# Patient Record
Sex: Female | Born: 1950 | Race: White | Hispanic: No | Smoking: Never smoker
Health system: Southern US, Community
[De-identification: ages and names within clinical notes are randomized; demographics above are authoritative.]

## PROBLEM LIST (undated history)

## (undated) DIAGNOSIS — E079 Disorder of thyroid, unspecified: Secondary | ICD-10-CM

---

## 1990-02-16 HISTORY — PX: BREAST BIOPSY: SHX20

## 2007-09-29 ENCOUNTER — Ambulatory Visit: Payer: Self-pay | Admitting: Internal Medicine

## 2010-03-26 ENCOUNTER — Ambulatory Visit: Payer: Self-pay | Admitting: Unknown Physician Specialty

## 2011-05-05 ENCOUNTER — Ambulatory Visit: Payer: Self-pay | Admitting: Internal Medicine

## 2011-05-28 ENCOUNTER — Ambulatory Visit: Payer: Self-pay | Admitting: Family Medicine

## 2012-06-23 ENCOUNTER — Ambulatory Visit: Payer: Self-pay | Admitting: Family Medicine

## 2014-06-01 ENCOUNTER — Other Ambulatory Visit: Payer: Self-pay | Admitting: Family Medicine

## 2014-06-01 DIAGNOSIS — Z1231 Encounter for screening mammogram for malignant neoplasm of breast: Secondary | ICD-10-CM

## 2014-07-18 ENCOUNTER — Other Ambulatory Visit: Payer: Self-pay | Admitting: Obstetrics and Gynecology

## 2014-07-18 ENCOUNTER — Ambulatory Visit
Admission: RE | Admit: 2014-07-18 | Discharge: 2014-07-18 | Disposition: A | Discharge status: Home / Self Care | Payer: BC Managed Care – PPO | Source: Ambulatory Visit | Attending: Family Medicine | Admitting: Family Medicine

## 2014-07-18 DIAGNOSIS — R922 Inconclusive mammogram: Secondary | ICD-10-CM | POA: Insufficient documentation

## 2014-07-18 DIAGNOSIS — Z1231 Encounter for screening mammogram for malignant neoplasm of breast: Secondary | ICD-10-CM

## 2015-07-02 ENCOUNTER — Other Ambulatory Visit: Payer: Self-pay | Admitting: Family Medicine

## 2015-07-02 DIAGNOSIS — Z1231 Encounter for screening mammogram for malignant neoplasm of breast: Secondary | ICD-10-CM

## 2015-07-22 ENCOUNTER — Ambulatory Visit
Admission: RE | Admit: 2015-07-22 | Discharge: 2015-07-22 | Disposition: A | Discharge status: Home / Self Care | Payer: Medicare Other | Source: Ambulatory Visit | Attending: Family Medicine | Admitting: Family Medicine

## 2015-07-22 DIAGNOSIS — Z1231 Encounter for screening mammogram for malignant neoplasm of breast: Secondary | ICD-10-CM | POA: Diagnosis present

## 2016-06-30 ENCOUNTER — Other Ambulatory Visit: Payer: Self-pay | Admitting: Family Medicine

## 2016-06-30 DIAGNOSIS — Z1231 Encounter for screening mammogram for malignant neoplasm of breast: Secondary | ICD-10-CM

## 2016-07-27 ENCOUNTER — Ambulatory Visit
Admission: RE | Admit: 2016-07-27 | Discharge: 2016-07-27 | Disposition: A | Discharge status: Home / Self Care | Payer: Medicare Other | Source: Ambulatory Visit | Attending: Family Medicine | Admitting: Family Medicine

## 2016-07-27 DIAGNOSIS — Z1231 Encounter for screening mammogram for malignant neoplasm of breast: Secondary | ICD-10-CM | POA: Insufficient documentation

## 2017-07-16 ENCOUNTER — Other Ambulatory Visit: Payer: Self-pay | Admitting: Family Medicine

## 2017-07-16 DIAGNOSIS — Z1231 Encounter for screening mammogram for malignant neoplasm of breast: Secondary | ICD-10-CM

## 2017-07-28 ENCOUNTER — Encounter (INDEPENDENT_AMBULATORY_CARE_PROVIDER_SITE_OTHER): Payer: Self-pay

## 2017-07-28 ENCOUNTER — Ambulatory Visit
Admission: RE | Admit: 2017-07-28 | Discharge: 2017-07-28 | Disposition: A | Discharge status: Home / Self Care | Payer: Medicare Other | Source: Ambulatory Visit | Attending: Family Medicine | Admitting: Family Medicine

## 2017-07-28 DIAGNOSIS — Z1231 Encounter for screening mammogram for malignant neoplasm of breast: Secondary | ICD-10-CM | POA: Diagnosis present

## 2018-11-23 ENCOUNTER — Other Ambulatory Visit: Payer: Self-pay | Admitting: Family Medicine

## 2018-11-23 DIAGNOSIS — Z1231 Encounter for screening mammogram for malignant neoplasm of breast: Secondary | ICD-10-CM

## 2018-12-20 ENCOUNTER — Encounter (INDEPENDENT_AMBULATORY_CARE_PROVIDER_SITE_OTHER): Payer: Self-pay

## 2018-12-20 ENCOUNTER — Other Ambulatory Visit: Payer: Self-pay

## 2018-12-20 ENCOUNTER — Ambulatory Visit
Admission: RE | Admit: 2018-12-20 | Discharge: 2018-12-20 | Disposition: A | Discharge status: Home / Self Care | Payer: Medicare Other | Source: Ambulatory Visit | Attending: Family Medicine | Admitting: Family Medicine

## 2018-12-20 DIAGNOSIS — Z1231 Encounter for screening mammogram for malignant neoplasm of breast: Secondary | ICD-10-CM | POA: Diagnosis present

## 2019-11-27 ENCOUNTER — Other Ambulatory Visit: Payer: Self-pay | Admitting: Family Medicine

## 2019-11-27 DIAGNOSIS — Z1231 Encounter for screening mammogram for malignant neoplasm of breast: Secondary | ICD-10-CM

## 2019-12-27 ENCOUNTER — Ambulatory Visit
Admission: RE | Admit: 2019-12-27 | Discharge: 2019-12-27 | Disposition: A | Discharge status: Home / Self Care | Payer: Medicare PPO | Source: Ambulatory Visit | Attending: Family Medicine | Admitting: Family Medicine

## 2019-12-27 ENCOUNTER — Other Ambulatory Visit: Payer: Self-pay

## 2019-12-27 DIAGNOSIS — Z1231 Encounter for screening mammogram for malignant neoplasm of breast: Secondary | ICD-10-CM

## 2020-05-27 ENCOUNTER — Emergency Department: Payer: Medicare PPO

## 2020-05-27 ENCOUNTER — Emergency Department
Admission: EM | Admit: 2020-05-27 | Discharge: 2020-05-27 | Disposition: A | Discharge status: Home / Self Care | Payer: Medicare PPO | Attending: Emergency Medicine | Admitting: Emergency Medicine

## 2020-05-27 ENCOUNTER — Other Ambulatory Visit: Payer: Self-pay

## 2020-05-27 DIAGNOSIS — S52501A Unspecified fracture of the lower end of right radius, initial encounter for closed fracture: Secondary | ICD-10-CM | POA: Insufficient documentation

## 2020-05-27 DIAGNOSIS — S42352A Displaced comminuted fracture of shaft of humerus, left arm, initial encounter for closed fracture: Secondary | ICD-10-CM | POA: Diagnosis not present

## 2020-05-27 DIAGNOSIS — Y92009 Unspecified place in unspecified non-institutional (private) residence as the place of occurrence of the external cause: Secondary | ICD-10-CM | POA: Insufficient documentation

## 2020-05-27 DIAGNOSIS — S0031XA Abrasion of nose, initial encounter: Secondary | ICD-10-CM | POA: Diagnosis not present

## 2020-05-27 DIAGNOSIS — S4992XA Unspecified injury of left shoulder and upper arm, initial encounter: Secondary | ICD-10-CM | POA: Diagnosis present

## 2020-05-27 DIAGNOSIS — W010XXA Fall on same level from slipping, tripping and stumbling without subsequent striking against object, initial encounter: Secondary | ICD-10-CM | POA: Insufficient documentation

## 2020-05-27 DIAGNOSIS — S62102A Fracture of unspecified carpal bone, left wrist, initial encounter for closed fracture: Secondary | ICD-10-CM

## 2020-05-27 DIAGNOSIS — W19XXXA Unspecified fall, initial encounter: Secondary | ICD-10-CM

## 2020-05-27 HISTORY — DX: Disorder of thyroid, unspecified: E07.9

## 2020-05-27 MED ORDER — ONDANSETRON 8 MG PO TBDP: 8.0000 mg | ORAL_TABLET | Freq: Three times a day (TID) | ORAL | 0 refills | Status: DC | PRN

## 2020-05-27 MED ORDER — ONDANSETRON HCL 4 MG/2ML IJ SOLN
4.0000 mg | Freq: Once | INTRAMUSCULAR | Status: AC
  Administered 2020-05-27: 4 mg via INTRAVENOUS
  Filled 2020-05-27: qty 2

## 2020-05-27 MED ORDER — HYDROMORPHONE HCL 1 MG/ML IJ SOLN
0.5000 mg | Freq: Once | INTRAMUSCULAR | Status: AC
  Administered 2020-05-27: 0.5 mg via INTRAVENOUS
  Filled 2020-05-27: qty 1

## 2020-05-27 MED ORDER — IBUPROFEN 600 MG PO TABS: 600.0000 mg | ORAL_TABLET | Freq: Three times a day (TID) | ORAL | 0 refills | Status: DC | PRN

## 2020-05-27 MED ORDER — OXYCODONE-ACETAMINOPHEN 5-325 MG PO TABS
1.0000 | ORAL_TABLET | Freq: Once | ORAL | Status: AC
  Administered 2020-05-27: 1 via ORAL
  Filled 2020-05-27: qty 1

## 2020-05-27 MED ORDER — OXYCODONE-ACETAMINOPHEN 5-325 MG PO TABS: 1.0000 | ORAL_TABLET | ORAL | 0 refills | Status: DC | PRN

## 2020-05-27 MED ORDER — FAMOTIDINE IN NACL 20-0.9 MG/50ML-% IV SOLN
20.0000 mg | Freq: Once | INTRAVENOUS | Status: AC
  Administered 2020-05-27: 20 mg via INTRAVENOUS
  Filled 2020-05-27: qty 50

## 2020-05-27 NOTE — ED Notes (Signed)
XR at bedside

## 2020-05-27 NOTE — Discharge Instructions (Addendum)
1.  You may take pain medicines as needed (Motrin/Percocet). 2.  Wear sling at all times. 3.  Keep splint clean and dry. 4.  You may apply ice to the area of swelling several times daily to reduce swelling. 5.  Return to the ER for worsening symptoms, persistent vomiting, difficulty breathing or other concerns.

## 2020-05-27 NOTE — ED Notes (Signed)
SW at bedside at this time

## 2020-05-27 NOTE — ED Provider Notes (Signed)
-----------------------------------------   11:01 AM on 05/27/2020 -----------------------------------------  The patient has been evaluated by social work and arrangements have been made for home health.  She is stable for discharge at this time.  Prescriptions have been sent to her requested pharmacy.  Return precautions provided.   Dionne Bucy, MD 05/27/20 1101

## 2020-05-27 NOTE — ED Notes (Signed)
Pt given breakfast tray at this time. 

## 2020-05-27 NOTE — ED Notes (Addendum)
Chief Complaint  Patient presents with  . Fall    Pt tripped and fell at her home. Endorses L shoulder and arm pain. States she thinks she hit her head. -LOC -blood thinners   Pt presenting via EMS from home with the above complaint. Denies pain other than in L arm. Pt guarding L arm upon arrival. Pt noted to have L upper arm deformity. Pt's nightgown cut off, pt placed in hospital gown. AAO VSS

## 2020-05-27 NOTE — TOC Transition Note (Signed)
Transition of Care [facility]) - CM/SW Discharge Note   Patient Details  Name: Melody Reed MRN: [id number] Date of Birth: [birth year]  [facility] CM/SW Contact:  Marina Goodell Phone Number: [phone] 05/27/2020, 11:46 AM   Clinical Narrative:    Patient will d/c home with home health PT, OT and [facility] A[facility], with [facility].  Patient's will pick her up.     Barriers to Discharge: No Barriers Identified   Patient Goals and CMS Choice Patient states their goals for this hospitalization and ongoing recovery are:: Get better      Discharge Placement                       Discharge Plan and Services                                     Social Determinants of Health (SDOH) Interventions     Readmission Risk Interventions No flowsheet data found.

## 2020-05-27 NOTE — ED Notes (Signed)
Pt requesting to speak with EDP. Pt stated she think something is "disconnected" in her arm. Pt stated she is thinking about moving her arm up and down but is it not moving. This RN tried to educate pt that her arm is fractured and is splinted in a sling and that it may be hard to move, which is the reasoning for the sling. Pt insisted that she make the EDP aware. Dr. Dolores Frame notified at this time.

## 2020-05-27 NOTE — ED Provider Notes (Signed)
[facility] Emergency Department Provider Note   ____________________________________________   Event Date/Time   First MD Initiated Contact with Patient 05/27/20 0205     (approximate)  I have reviewed the triage vital signs and the nursing notes.   HISTORY  Chief Complaint Fall (Pt tripped and fell at her home. Endorses L shoulder and arm pain. States she thinks she hit her head. -LOC -blood thinners)    HPI ELISABETTA MISHRA is a 70 y.o. female brought to the ED via EMS from home status post mechanical fall.  Patient was watching TV, got up, tripped and fell onto her left, nondominant shoulder and upper arm.  Thinks her glasses cut the bridge of her nose.  Denies LOC.  Denies vision changes, headache, neck pain, chest pain, shortness of breath, abdominal pain, nausea, vomiting or dizziness.  Patient denies anticoagulant use.     Past medical history Depression Heart murmur Graves' disease Hyperlipidemia Osteoarthritis Osteoporosis Thyroid disease There are no problems to display for this patient.   Past Surgical History:  Procedure Laterality Date  . BREAST BIOPSY Left 1992   calcifications - benign    Prior to Admission medications   Medication Sig Start Date End Date Taking? Authorizing Provider  ibuprofen (ADVIL) 600 MG tablet Take 1 tablet (600 mg total) by mouth every 8 (eight) hours as needed. 05/27/20  Yes Irean Hong, MD  oxyCODONE-acetaminophen (PERCOCET/ROXICET) 5-325 MG tablet Take 1 tablet by mouth every 4 (four) hours as needed for severe pain. 05/27/20  Yes Irean Hong, MD    Allergies Patient has no known allergies.  Family History  Problem Relation Age of Onset  . Breast cancer Mother 25    Social History Social History   Tobacco Use  . Smoking status: Never Smoker  . Smokeless tobacco: Never Used  Vaping Use  . Vaping Use: Never used  Substance Use Topics  . Alcohol use: Not Currently  . Drug use: Never     Review of Systems  Constitutional: No fever/chills Eyes: No visual changes. ENT: No sore throat. Cardiovascular: Denies chest pain. Respiratory: Denies shortness of breath. Gastrointestinal: No abdominal pain.  No nausea, no vomiting.  No diarrhea.  No constipation. Genitourinary: Negative for dysuria. Musculoskeletal: Positive for LUE pain.  Negative for back pain. Skin: Negative for rash. Neurological: Negative for headaches, focal weakness or numbness.   ____________________________________________   PHYSICAL EXAM:  VITAL SIGNS: ED Triage Vitals  Enc Vitals Group     BP      Pulse      Resp      Temp      Temp src      SpO2      Weight      Height      Head Circumference      Peak Flow      Pain Score      Pain Loc      Pain Edu?      Excl. in GC?     Constitutional: Alert and oriented. Well appearing and in moderate acute distress. Eyes: Conjunctivae are normal. PERRL. EOMI. Head: Atraumatic. Nose: Small abrasion to right nasal bridge without active bleeding. Mouth/Throat: Mucous membranes are moist.  No dental malocclusion. Neck: No stridor.  No cervical spine tenderness to palpation. Cardiovascular: Normal rate, regular rhythm. Grossly normal heart sounds.  Good peripheral circulation. Respiratory: Normal respiratory effort.  No retractions. Lungs CTAB. Gastrointestinal: Soft and nontender to D palpation. No distention.  No abdominal bruits. No CVA tenderness. Musculoskeletal:  LUE: Deformity of upper humerus.  Arm abducted and internally rotated.  Limited range of motion secondary to pain.  2+ radial pulses.  Brisk, less than 5-second capillary refill. No lower extremity tenderness nor edema.  No joint effusions. Neurologic:  Normal speech and language. No gross focal neurologic deficits are appreciated. No gait instability. Skin:  Skin is warm, dry and intact. No rash noted. Psychiatric: Mood and affect are normal. Speech and behavior are  normal.  ____________________________________________   LABS (all labs ordered are listed, but only abnormal results are displayed)  Labs Reviewed - No data to display ____________________________________________  EKG  None ____________________________________________  RADIOLOGY I, Lorrin Bodner J, personally viewed and evaluated these images (plain radiographs) as part of my medical decision making, as well as reviewing the written report by the radiologist.  ED MD interpretation: Comminuted proximal shaft left humerus fracture, acute mildly impacted distal radius fracture; right hand without acute traumatic injury  Official radiology report(s): DG Wrist Complete Left  Result Date: 05/27/2020 CLINICAL DATA:  Fall with wrist pain EXAM: LEFT WRIST - COMPLETE 3+ VIEW COMPARISON:  None. FINDINGS: Acute mildly impacted distal radius fracture. No subluxation. Degenerative changes at the first [facility] joint and STT interval. IMPRESSION: Acute mildly impacted distal radius fracture. Electronically Signed   By: Jasmine Pang M.D.   On: 05/27/2020 03:19   DG Shoulder Left  Result Date: 05/27/2020 CLINICAL DATA:  Fall with arm pain EXAM: LEFT SHOULDER - 2+ VIEW COMPARISON:  None. FINDINGS: Comminuted fracture of the proximal shaft of the left humerus. No glenohumeral dislocation. IMPRESSION: Comminuted fracture of the proximal shaft of the left humerus. Electronically Signed   By: Deatra Robinson M.D.   On: 05/27/2020 02:28   DG Humerus Left  Result Date: 05/27/2020 CLINICAL DATA:  Fall EXAM: LEFT HUMERUS - 2+ VIEW COMPARISON:  None. FINDINGS: Comminuted fracture of the proximal left humeral shaft. Mild posterior angulation. IMPRESSION: Comminuted fracture of the proximal left humeral shaft. Electronically Signed   By: Deatra Robinson M.D.   On: 05/27/2020 02:32   DG Hand Complete Right  Result Date: 05/27/2020 CLINICAL DATA:  Fall, right hand bruising EXAM: RIGHT HAND - COMPLETE 3+ VIEW COMPARISON:   None. FINDINGS: Three view radiograph right hand demonstrates severe degenerative arthritis of the DIP and PIP joints of the digits with mild lateral subluxation of the second, third, and fourth PIP joints. Milder degenerative changes are noted involving the interphalangeal joint of the thumb as well as the first carpometacarpal joint and first MCP joint of the thumb. No acute fracture or dislocation. Soft tissues are unremarkable. 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 No acute fracture or dislocation. Electronically Signed   By: Helyn Numbers MD   On: 05/27/2020 04:44    ____________________________________________   PROCEDURES  Procedure(s) performed (including Critical Care):  .Splint Application  Date/Time: 05/27/2020 4:12 AM Performed by: Irean Hong, MD Authorized by: Irean Hong, MD   Consent:    Consent obtained:  Verbal   Consent given by:  Patient   Risks, benefits, and alternatives were discussed: yes     Risks discussed:  Discoloration, numbness, pain and swelling   Alternatives discussed:  No treatment Pre-procedure details:    Distal neurologic exam:  Normal   Distal perfusion: distal pulses strong and brisk capillary refill   Procedure details:    Location:  Wrist   Wrist location:  L wrist   Splint type:  Thumb spica   Supplies:  Cotton padding, elastic bandage and plaster Post-procedure details:    Distal neurologic exam:  Normal   Distal perfusion: distal pulses strong     Procedure completion:  Tolerated well, no immediate complications     ____________________________________________   INITIAL IMPRESSION / ASSESSMENT AND PLAN / ED COURSE  As part of my medical decision making, I reviewed the following data within the electronic MEDICAL RECORD NUMBER Nursing notes reviewed and incorporated, Old chart reviewed, Radiograph reviewed, Notes from prior ED visits and [place] Controlled Substance Database     70 year old female presenting  with left arm injury status post mechanical fall.  Differential diagnosis includes but is not limited to shoulder dislocation, humerus fracture, musculoskeletal contusion, etc.  We will obtain plain film x-rays, administer IV Dilaudid for pain and reassess.  Clinical Course as of 05/27/20 0653  Mon May 27, 2020  0236 Discussed with orthopedist on-call Dr. Odis Luster who will look at patient's x-ray and call back with recommendations. [JS]  0303 Dr. Odis Luster recommends sling and follow-up in the office in 2 to 3 days.  Updated patient on plan of care.  She is now complaining of left wrist pain.  Will obtain plain film x-rays.  Pain improved after IV Dilaudid; will administer oral Percocet for additional pain control [JS]  0352 Updated patient on wrist x-ray; will apply thumb spica splint.  Patient concerned she will have no one to help her at home as her son lives in town but works in [place].  Will consult TOC for home health needs. [JS]  [id number] Patient now complains of right finger pain.  Will obtain x-rays. [JS]  0448 Right hand x-rays negative for acute traumatic injury. [JS]  [id number] Splint and sling in place.  Patient resting in no acute distress.  Remains in the ED pending clinical social work assistance for home health. [JS]    Clinical Course User Index [JS] Irean Hong, MD     ____________________________________________   FINAL CLINICAL IMPRESSION(S) / ED DIAGNOSES  Final diagnoses:  Fall, initial encounter  Closed displaced comminuted fracture of shaft of left humerus, initial encounter  Closed fracture of left wrist, initial encounter     ED Discharge Orders         Ordered    ibuprofen (ADVIL) 600 MG tablet  Every 8 hours PRN        05/27/20 0305    oxyCODONE-acetaminophen (PERCOCET/ROXICET) 5-325 MG tablet  Every 4 hours PRN        05/27/20 0305          *Please note:  SHANNELL MIKKELSEN was evaluated in Emergency Department on 05/27/2020 for the symptoms described in the  history of present illness. She was evaluated in the context of the global COVID-19 pandemic, which necessitated consideration that the patient might be at risk for infection with the SARS-CoV-2 virus that causes COVID-19. Institutional protocols and algorithms that pertain to the evaluation of patients at risk for COVID-19 are in a state of rapid change based on information released by regulatory bodies including the CDC and federal and state organizations. These policies and algorithms were followed during the patient's care in the ED.  Some ED evaluations and interventions may be delayed as a result of limited staffing during and the pandemic.*   Note:  This document was prepared using Dragon voice recognition software and may include unintentional dictation errors.   Irean Hong, MD 05/27/20 [phone]

## 2020-11-12 ENCOUNTER — Other Ambulatory Visit: Payer: Self-pay | Admitting: Family Medicine

## 2020-11-12 DIAGNOSIS — Z1231 Encounter for screening mammogram for malignant neoplasm of breast: Secondary | ICD-10-CM

## 2020-12-27 ENCOUNTER — Other Ambulatory Visit: Payer: Self-pay

## 2020-12-27 ENCOUNTER — Ambulatory Visit
Admission: RE | Admit: 2020-12-27 | Discharge: 2020-12-27 | Disposition: A | Discharge status: Home / Self Care | Payer: Medicare PPO | Source: Ambulatory Visit | Attending: Family Medicine | Admitting: Family Medicine

## 2020-12-27 DIAGNOSIS — Z1231 Encounter for screening mammogram for malignant neoplasm of breast: Secondary | ICD-10-CM | POA: Insufficient documentation

## 2022-01-19 ENCOUNTER — Other Ambulatory Visit: Payer: Self-pay | Admitting: Family Medicine

## 2022-01-19 ENCOUNTER — Ambulatory Visit: Payer: Medicare PPO | Admitting: Dermatology

## 2022-01-19 DIAGNOSIS — L988 Other specified disorders of the skin and subcutaneous tissue: Secondary | ICD-10-CM

## 2022-01-19 DIAGNOSIS — L820 Inflamed seborrheic keratosis: Secondary | ICD-10-CM | POA: Diagnosis not present

## 2022-01-19 DIAGNOSIS — L821 Other seborrheic keratosis: Secondary | ICD-10-CM

## 2022-01-19 DIAGNOSIS — Z1231 Encounter for screening mammogram for malignant neoplasm of breast: Secondary | ICD-10-CM

## 2022-01-19 DIAGNOSIS — Z808 Family history of malignant neoplasm of other organs or systems: Secondary | ICD-10-CM

## 2022-01-19 DIAGNOSIS — L578 Other skin changes due to chronic exposure to nonionizing radiation: Secondary | ICD-10-CM | POA: Diagnosis not present

## 2022-01-19 NOTE — Progress Notes (Signed)
   New Patient Visit  Subjective  Melody Reed is a 71 y.o. female who presents for the following: New Patient (Initial Visit) (Reports dad sister had melanoma stage 4./Has history of precancers. /Spot at left neck area /). The patient has spots, moles and lesions to be evaluated, some may be new or changing and the patient has concerns that these could be cancer.  The following portions of the chart were reviewed this encounter and updated as appropriate:   Tobacco  Allergies  Meds  Problems  Med Hx  Surg Hx  Fam Hx     Review of Systems:  No other skin or systemic complaints except as noted in HPI or Assessment and Plan.  Objective  Well appearing patient in no apparent distress; mood and affect are within normal limits.  A focused examination was performed including left neck, and face. Relevant physical exam findings are noted in the Assessment and Plan.  face Rhytides and volume loss.   left neck x 1 Erythematous stuck-on, waxy papule or plaque   Assessment & Plan  Elastosis of skin face Concerned with lines at forehead Frown complex 27 1/2 5 across forehead 32.5 units total  422.50  Discussed botox at corners of mouth 4 units each side Sagging skin and lines around  mid face Discussed fillers with hydrologic acid  650 per syringe   Inflamed seborrheic keratosis left neck x 1 Symptomatic, irritating, patient would like treated.  Destruction of lesion - left neck x 1 Complexity: simple   Destruction method: cryotherapy   Informed consent: discussed and consent obtained   Timeout:  patient name, date of birth, surgical site, and procedure verified Lesion destroyed using liquid nitrogen: Yes   Region frozen until ice ball extended beyond lesion: Yes   Outcome: patient tolerated procedure well with no complications   Post-procedure details: wound care instructions given   Additional details:  Prior to procedure, discussed risks of blister formation,  small wound, skin dyspigmentation, or rare scar following cryotherapy. Recommend Vaseline ointment to treated areas while healing.  Actinic Damage - chronic, secondary to cumulative UV radiation exposure/sun exposure over time - diffuse scaly erythematous macules with underlying dyspigmentation - Recommend daily broad spectrum sunscreen SPF 30+ to sun-exposed areas, reapply every 2 hours as needed.  - Recommend staying in the shade or wearing long sleeves, sun glasses (UVA+UVB protection) and wide brim hats (4-inch brim around the entire circumference of the hat). - Call for new or changing lesions.  Seborrheic Keratoses - Stuck-on, waxy, tan-brown papules and/or plaques  - Benign-appearing - Discussed benign etiology and prognosis. - Observe - Call for any changes  Return for scheduled for botox tomorrow at 1:30 pm with Dr. Verdell Face, Asher Muir, CMA, am acting as scribe for Armida Sans, MD. Documentation: I have reviewed the above documentation for accuracy and completeness, and I agree with the above.  Armida Sans, MD

## 2022-01-19 NOTE — Patient Instructions (Addendum)
Seborrheic Keratosis  What causes seborrheic keratoses? Seborrheic keratoses are harmless, common skin growths that first appear during adult life.  As time goes by, more growths appear.  Some people may develop a large number of them.  Seborrheic keratoses appear on both covered and uncovered body parts.  They are not caused by sunlight.  The tendency to develop seborrheic keratoses can be inherited.  They vary in color from skin-colored to gray, brown, or even black.  They can be either smooth or have a rough, warty surface.   Seborrheic keratoses are superficial and look as if they were stuck on the skin.  Under the microscope this type of keratosis looks like layers upon layers of skin.  That is why at times the top layer may seem to fall off, but the rest of the growth remains and re-grows.    Treatment Seborrheic keratoses do not need to be treated, but can easily be removed in the office.  Seborrheic keratoses often cause symptoms when they rub on clothing or jewelry.  Lesions can be in the way of shaving.  If they become inflamed, they can cause itching, soreness, or burning.  Removal of a seborrheic keratosis can be accomplished by freezing, burning, or surgery. If any spot bleeds, scabs, or grows rapidly, please return to have it checked, as these can be an indication of a skin cancer.  Cryotherapy Aftercare  Wash gently with soap and water everyday.   Apply Vaseline and Band-Aid daily until healed.    Due to recent changes in healthcare laws, you may see results of your pathology and/or laboratory studies on MyChart before the doctors have had a chance to review them. We understand that in some cases there may be results that are confusing or concerning to you. Please understand that not all results are received at the same time and often the doctors may need to interpret multiple results in order to provide you with the best plan of care or course of treatment. Therefore, we ask that you  please give us 2 business days to thoroughly review all your results before contacting the office for clarification. Should we see a critical lab result, you will be contacted sooner.   If You Need Anything After Your Visit  If you have any questions or concerns for your doctor, please call our main line at [phone] and press option 4 to reach your doctor's medical assistant. If no one answers, please leave a voicemail as directed and we will return your call as soon as possible. Messages left after 4 pm will be answered the following business day.   You may also send us a message via MyChart. We typically respond to MyChart messages within 1-2 business days.  For prescription refills, please ask your pharmacy to contact our office. Our fax number is [phone].  If you have an urgent issue when the clinic is closed that cannot wait until the next business day, you can page your doctor at the number below.    Please note that while we do our best to be available for urgent issues outside of office hours, we are not available 24/7.   If you have an urgent issue and are unable to reach us, you may choose to seek medical care at your doctor's office, retail clinic, urgent care center, or emergency room.  If you have a medical emergency, please immediately call 911 or go to the emergency department.  Pager Numbers  - Dr. Kowalski: [phone]  -   Dr. Moye: [phone]  - Dr. Stewart: [phone]  In the event of inclement weather, please call our main line at [phone] for an update on the status of any delays or closures.  Dermatology Medication Tips: Please keep the boxes that topical medications come in in order to help keep track of the instructions about where and how to use these. Pharmacies typically print the medication instructions only on the boxes and not directly on the medication tubes.   If your medication is too expensive, please contact our office at  [phone] option 4 or send us a message through MyChart.   We are unable to tell what your co-pay for medications will be in advance as this is different depending on your insurance coverage. However, we may be able to find a substitute medication at lower cost or fill out paperwork to get insurance to cover a needed medication.   If a prior authorization is required to get your medication covered by your insurance company, please allow us 1-2 business days to complete this process.  Drug prices often vary depending on where the prescription is filled and some pharmacies may offer cheaper prices.  The website www.goodrx.com contains coupons for medications through different pharmacies. The prices here do not account for what the cost may be with help from insurance (it may be cheaper with your insurance), but the website can give you the price if you did not use any insurance.  - You can print the associated coupon and take it with your prescription to the pharmacy.  - You may also stop by our office during regular business hours and pick up a GoodRx coupon card.  - If you need your prescription sent electronically to a different pharmacy, notify our office through [facility] MyChart or by phone at [phone] option 4.     Si Usted Necesita Algo Despus de Su Visita  Tambin puede enviarnos un mensaje a travs de MyChart. Por lo general respondemos a los mensajes de MyChart en el transcurso de 1 a 2 das hbiles.  Para renovar recetas, por favor pida a su farmacia que se ponga en contacto con nuestra oficina. Nuestro nmero de fax es el [phone].  Si tiene un asunto urgente cuando la clnica est cerrada y que no puede esperar hasta el siguiente da hbil, puede llamar/localizar a su doctor(a) al nmero que aparece a continuacin.   Por favor, tenga en cuenta que aunque hacemos todo lo posible para estar disponibles para asuntos urgentes fuera del horario de oficina, no estamos  disponibles las 24 horas del da, los 7 das de la semana.   Si tiene un problema urgente y no puede comunicarse con nosotros, puede optar por buscar atencin mdica  en el consultorio de su doctor(a), en una clnica privada, en un centro de atencin urgente o en una sala de emergencias.  Si tiene una emergencia mdica, por favor llame inmediatamente al 911 o vaya a la sala de emergencias.  Nmeros de bper  - Dr. Kowalski: [phone]  - Dra. Moye: [phone]  - Dra. Stewart: [phone]  En caso de inclemencias del tiempo, por favor llame a nuestra lnea principal al [phone] para una actualizacin sobre el estado de cualquier retraso o cierre.  Consejos para la medicacin en dermatologa: Por favor, guarde las cajas en las que vienen los medicamentos de uso tpico para ayudarle a seguir las instrucciones sobre dnde y cmo usarlos. Las farmacias generalmente imprimen las instrucciones del medicamento slo en las cajas y   no directamente en los tubos del medicamento.   Si su medicamento es muy caro, por favor, pngase en contacto con nuestra oficina llamando al [phone] y presione la opcin 4 o envenos un mensaje a travs de MyChart.   No podemos decirle cul ser su copago por los medicamentos por adelantado ya que esto es diferente dependiendo de la cobertura de su seguro. Sin embargo, es posible que podamos encontrar un medicamento sustituto a menor costo o llenar un formulario para que el seguro cubra el medicamento que se considera necesario.   Si se requiere una autorizacin previa para que su compaa de seguros cubra su medicamento, por favor permtanos de 1 a 2 das hbiles para completar este proceso.  Los precios de los medicamentos varan con frecuencia dependiendo del lugar de dnde se surte la receta y alguna farmacias pueden ofrecer precios ms baratos.  El sitio web www.goodrx.com tiene cupones para medicamentos de diferentes farmacias. Los precios aqu no  tienen en cuenta lo que podra costar con la ayuda del seguro (puede ser ms barato con su seguro), pero el sitio web puede darle el precio si no utiliz ningn seguro.  - Puede imprimir el cupn correspondiente y llevarlo con su receta a la farmacia.  - Tambin puede pasar por nuestra oficina durante el horario de atencin regular y recoger una tarjeta de cupones de GoodRx.  - Si necesita que su receta se enve electrnicamente a una farmacia diferente, informe a nuestra oficina a travs de MyChart de [facility] o por telfono llamando al [phone] y presione la opcin 4.  

## 2022-01-20 ENCOUNTER — Ambulatory Visit (INDEPENDENT_AMBULATORY_CARE_PROVIDER_SITE_OTHER): Payer: Self-pay | Admitting: Dermatology

## 2022-01-20 DIAGNOSIS — L988 Other specified disorders of the skin and subcutaneous tissue: Secondary | ICD-10-CM

## 2022-01-20 NOTE — Progress Notes (Signed)
   Follow-Up Visit   Subjective  Melody Reed is a 71 y.o. female who presents for the following: Facial Elastosis (Botox today).  The following portions of the chart were reviewed this encounter and updated as appropriate:   Tobacco  Allergies  Meds  Problems  Med Hx  Surg Hx  Fam Hx     Review of Systems:  No other skin or systemic complaints except as noted in HPI or Assessment and Plan.  Objective  Well appearing patient in no apparent distress; mood and affect are within normal limits.  A focused examination was performed including face. Relevant physical exam findings are noted in the Assessment and Plan.  Face Rhytides and volume loss.             Assessment & Plan  Elastosis of skin Face  Botox today - 32.5 units total - Recheck on follow up  Frown Complex - 27.5 units Forehead - 5 units  Discussed Botox to DAOs and filler to bilateral oral commissure. Patient is scheduled for treatment in March 2024.  Botox Injection - Face Location: See attached image  Informed consent: Discussed risks (infection, pain, bleeding, bruising, swelling, allergic reaction, paralysis of nearby muscles, eyelid droop, double vision, neck weakness, difficulty breathing, headache, undesirable cosmetic result, and need for additional treatment) and benefits of the procedure, as well as the alternatives.  Informed consent was obtained.  Preparation: The area was cleansed with alcohol.  Procedure Details:  Botox was injected into the dermis with a 30-gauge needle. Pressure applied to any bleeding. Ice packs offered for swelling.  Lot Number:  [id number] Expiration:  03/2024  Total Units Injected:  32.5  Plan: Patient was instructed to remain upright for 4 hours. Patient was instructed to avoid massaging the face and avoid vigorous exercise for the rest of the day. Tylenol may be used for headache.  Allow 2 weeks before returning to clinic for additional dosing as needed.  Patient will call for any problems.    Return in about 4 weeks (around 02/17/2022).  I, Joanie Coddington, CMA, am acting as scribe for Armida Sans, MD . Documentation: I have reviewed the above documentation for accuracy and completeness, and I agree with the above.  Armida Sans, MD

## 2022-01-20 NOTE — Patient Instructions (Signed)
Due to recent changes in healthcare laws, you may see results of your pathology and/or laboratory studies on MyChart before the doctors have had a chance to review them. We understand that in some cases there may be results that are confusing or concerning to you. Please understand that not all results are received at the same time and often the doctors may need to interpret multiple results in order to provide you with the best plan of care or course of treatment. Therefore, we ask that you please give us 2 business days to thoroughly review all your results before contacting the office for clarification. Should we see a critical lab result, you will be contacted sooner.   If You Need Anything After Your Visit  If you have any questions or concerns for your doctor, please call our main line at [phone] and press option 4 to reach your doctor's medical assistant. If no one answers, please leave a voicemail as directed and we will return your call as soon as possible. Messages left after 4 pm will be answered the following business day.   You may also send us a message via MyChart. We typically respond to MyChart messages within 1-2 business days.  For prescription refills, please ask your pharmacy to contact our office. Our fax number is [phone].  If you have an urgent issue when the clinic is closed that cannot wait until the next business day, you can page your doctor at the number below.    Please note that while we do our best to be available for urgent issues outside of office hours, we are not available 24/7.   If you have an urgent issue and are unable to reach us, you may choose to seek medical care at your doctor's office, retail clinic, urgent care center, or emergency room.  If you have a medical emergency, please immediately call 911 or go to the emergency department.  Pager Numbers  - Dr. Kowalski: [phone]  - Dr. Moye: [phone]  - Dr. Stewart:  [phone]  In the event of inclement weather, please call our main line at [phone] for an update on the status of any delays or closures.  Dermatology Medication Tips: Please keep the boxes that topical medications come in in order to help keep track of the instructions about where and how to use these. Pharmacies typically print the medication instructions only on the boxes and not directly on the medication tubes.   If your medication is too expensive, please contact our office at [phone] option 4 or send us a message through MyChart.   We are unable to tell what your co-pay for medications will be in advance as this is different depending on your insurance coverage. However, we may be able to find a substitute medication at lower cost or fill out paperwork to get insurance to cover a needed medication.   If a prior authorization is required to get your medication covered by your insurance company, please allow us 1-2 business days to complete this process.  Drug prices often vary depending on where the prescription is filled and some pharmacies may offer cheaper prices.  The website www.goodrx.com contains coupons for medications through different pharmacies. The prices here do not account for what the cost may be with help from insurance (it may be cheaper with your insurance), but the website can give you the price if you did not use any insurance.  - You can print the associated coupon and take it with   your prescription to the pharmacy.  - You may also stop by our office during regular business hours and pick up a GoodRx coupon card.  - If you need your prescription sent electronically to a different pharmacy, notify our office through [facility] MyChart or by phone at [phone] option 4.     Si Usted Necesita Algo Despus de Su Visita  Tambin puede enviarnos un mensaje a travs de MyChart. Por lo general respondemos a los mensajes de MyChart en el transcurso de 1 a 2  das hbiles.  Para renovar recetas, por favor pida a su farmacia que se ponga en contacto con nuestra oficina. Nuestro nmero de fax es el [phone].  Si tiene un asunto urgente cuando la clnica est cerrada y que no puede esperar hasta el siguiente da hbil, puede llamar/localizar a su doctor(a) al nmero que aparece a continuacin.   Por favor, tenga en cuenta que aunque hacemos todo lo posible para estar disponibles para asuntos urgentes fuera del horario de oficina, no estamos disponibles las 24 horas del da, los 7 das de la semana.   Si tiene un problema urgente y no puede comunicarse con nosotros, puede optar por buscar atencin mdica  en el consultorio de su doctor(a), en una clnica privada, en un centro de atencin urgente o en una sala de emergencias.  Si tiene una emergencia mdica, por favor llame inmediatamente al 911 o vaya a la sala de emergencias.  Nmeros de bper  - Dr. Kowalski: [phone]  - Dra. Moye: [phone]  - Dra. Stewart: [phone]  En caso de inclemencias del tiempo, por favor llame a nuestra lnea principal al [phone] para una actualizacin sobre el estado de cualquier retraso o cierre.  Consejos para la medicacin en dermatologa: Por favor, guarde las cajas en las que vienen los medicamentos de uso tpico para ayudarle a seguir las instrucciones sobre dnde y cmo usarlos. Las farmacias generalmente imprimen las instrucciones del medicamento slo en las cajas y no directamente en los tubos del medicamento.   Si su medicamento es muy caro, por favor, pngase en contacto con nuestra oficina llamando al [phone] y presione la opcin 4 o envenos un mensaje a travs de MyChart.   No podemos decirle cul ser su copago por los medicamentos por adelantado ya que esto es diferente dependiendo de la cobertura de su seguro. Sin embargo, es posible que podamos encontrar un medicamento sustituto a menor costo o llenar un formulario para que el  seguro cubra el medicamento que se considera necesario.   Si se requiere una autorizacin previa para que su compaa de seguros cubra su medicamento, por favor permtanos de 1 a 2 das hbiles para completar este proceso.  Los precios de los medicamentos varan con frecuencia dependiendo del lugar de dnde se surte la receta y alguna farmacias pueden ofrecer precios ms baratos.  El sitio web www.goodrx.com tiene cupones para medicamentos de diferentes farmacias. Los precios aqu no tienen en cuenta lo que podra costar con la ayuda del seguro (puede ser ms barato con su seguro), pero el sitio web puede darle el precio si no utiliz ningn seguro.  - Puede imprimir el cupn correspondiente y llevarlo con su receta a la farmacia.  - Tambin puede pasar por nuestra oficina durante el horario de atencin regular y recoger una tarjeta de cupones de GoodRx.  - Si necesita que su receta se enve electrnicamente a una farmacia diferente, informe a nuestra oficina a travs de MyChart de [place]   o por telfono llamando al [phone] y presione la opcin 4.  

## 2022-02-01 ENCOUNTER — Encounter: Payer: Self-pay | Admitting: Dermatology

## 2022-02-24 ENCOUNTER — Ambulatory Visit (INDEPENDENT_AMBULATORY_CARE_PROVIDER_SITE_OTHER): Payer: Medicare PPO | Admitting: Dermatology

## 2022-02-24 ENCOUNTER — Encounter: Payer: Self-pay | Admitting: Dermatology

## 2022-02-24 DIAGNOSIS — L988 Other specified disorders of the skin and subcutaneous tissue: Secondary | ICD-10-CM

## 2022-02-24 DIAGNOSIS — Z79899 Other long term (current) drug therapy: Secondary | ICD-10-CM | POA: Diagnosis not present

## 2022-02-24 DIAGNOSIS — L820 Inflamed seborrheic keratosis: Secondary | ICD-10-CM | POA: Diagnosis not present

## 2022-02-24 DIAGNOSIS — L308 Other specified dermatitis: Secondary | ICD-10-CM

## 2022-02-24 MED ORDER — MOMETASONE FUROATE 0.1 % EX CREA: TOPICAL_CREAM | CUTANEOUS | 1 refills | Status: AC

## 2022-02-24 NOTE — Patient Instructions (Addendum)
Start Mometasone cream twice daily 2 weeks.  Topical steroids (such as triamcinolone, fluocinolone, fluocinonide, mometasone, clobetasol, halobetasol, betamethasone, hydrocortisone) can cause thinning and lightening of the skin if they are used for too long in the same area. Your physician has selected the right strength medicine for your problem and area affected on the body. Please use your medication only as directed by your physician to prevent side effects.    Topical steroids (such as triamcinolone, fluocinolone, fluocinonide, mometasone, clobetasol, halobetasol, betamethasone, hydrocortisone) can cause thinning and lightening of the skin if they are used for too long in the same area. Your physician has selected the right strength medicine for your problem and area affected on the body. Please use your medication only as directed by your physician to prevent side effects.    Cryotherapy Aftercare  Wash gently with soap and water everyday.   Apply Vaseline and Band-Aid daily until healed.    Due to recent changes in healthcare laws, you may see results of your pathology and/or laboratory studies on MyChart before the doctors have had a chance to review them. We understand that in some cases there may be results that are confusing or concerning to you. Please understand that not all results are received at the same time and often the doctors may need to interpret multiple results in order to provide you with the best plan of care or course of treatment. Therefore, we ask that you please give [place] 2 business days to thoroughly review all your results before contacting the office for clarification. Should we see a critical lab result, you will be contacted sooner.   If You Need Anything After Your Visit  If you have any questions or concerns for your doctor, please call our main line at [phone] and press option 4 to reach your doctor's medical assistant. If no one answers, please leave a  voicemail as directed and we will return your call as soon as possible. Messages left after 4 pm will be answered the following business day.   You may also send [place] a message via MyChart. We typically respond to MyChart messages within 1-2 business days.  For prescription refills, please ask your pharmacy to contact our office. Our fax number is [phone].  If you have an urgent issue when the clinic is closed that cannot wait until the next business day, you can page your doctor at the number below.    Please note that while we do our best to be available for urgent issues outside of office hours, we are not available 24/7.   If you have an urgent issue and are unable to reach [place], you may choose to seek medical care at your doctor's office, retail clinic, urgent care center, or emergency room.  If you have a medical emergency, please immediately call 911 or go to the emergency department.  Pager Numbers  - Dr. Gwen Pounds: [phone]  - Dr. Neale Burly: [phone]  - Dr. Roseanne Reno: [phone]  In the event of inclement weather, please call our main line at [phone] for an update on the status of any delays or closures.  Dermatology Medication Tips: Please keep the boxes that topical medications come in in order to help keep track of the instructions about where and how to use these. Pharmacies typically print the medication instructions only on the boxes and not directly on the medication tubes.   If your medication is too expensive, please contact our office at [phone] option 4 or send [place] a  message through [place].   We are unable to tell what your co-pay for medications will be in advance as this is different depending on your insurance coverage. However, we may be able to find a substitute medication at lower cost or fill out paperwork to get insurance to cover a needed medication.   If a prior authorization is required to get your medication covered by your insurance  company, please allow [place] 1-2 business days to complete this process.  Drug prices often vary depending on where the prescription is filled and some pharmacies may offer cheaper prices.  The website www.goodrx.com contains coupons for medications through different pharmacies. The prices here do not account for what the cost may be with help from insurance (it may be cheaper with your insurance), but the website can give you the price if you did not use any insurance.  - You can print the associated coupon and take it with your prescription to the pharmacy.  - You may also stop by our office during regular business hours and pick up a GoodRx coupon card.  - If you need your prescription sent electronically to a different pharmacy, notify our office through [facility] or by phone at [phone] option 4.     Si Usted Necesita Algo Despus de Su Visita  Tambin puede enviarnos un mensaje a travs de Pharmacist, community. Por lo general respondemos a los mensajes de MyChart en el transcurso de 1 a 2 das hbiles.  Para renovar recetas, por favor pida a su farmacia que se ponga en contacto con nuestra oficina. Harland Dingwall de fax es [place] [phone].  Si tiene un asunto urgente cuando la clnica est cerrada y que no puede esperar hasta el siguiente da hbil, puede llamar/localizar a su doctor(a) al nmero que aparece a continuacin.   Por favor, tenga en cuenta que aunque hacemos todo lo posible para estar disponibles para asuntos urgentes fuera del horario de [place], no estamos disponibles las 24 horas del da, los 7 das de la [place].   Si tiene un problema urgente y no puede comunicarse con nosotros, puede optar por buscar atencin mdica  en el consultorio de su doctor(a), en una clnica privada, en un centro de atencin urgente o en una sala de emergencias.  Si tiene Engineering geologist, por favor llame inmediatamente al 911 o vaya a la sala de emergencias.  Nmeros de bper  - Dr.  Nehemiah Massed: [phone]  - Dra. Moye: [phone]  - Dra. Nicole Kindred: [phone]  En caso de inclemencias del [place], por favor llame a Johnsie Kindred principal al [phone] para una actualizacin sobre el [place] de cualquier retraso o cierre.  Consejos para la medicacin en dermatologa: Por favor, guarde las cajas en las que vienen los medicamentos de uso tpico para ayudarle a seguir las instrucciones sobre dnde y cmo usarlos. Las farmacias generalmente imprimen las instrucciones del medicamento slo en las cajas y no directamente en los tubos del [place].   Si su medicamento es muy caro, por favor, pngase en contacto con Zigmund Daniel llamando al [phone] y presione la opcin 4 o envenos un mensaje a travs de Pharmacist, community.   No podemos decirle cul ser su copago por los medicamentos por adelantado ya que esto es diferente dependiendo de la cobertura de su seguro. Sin embargo, es posible que podamos encontrar un medicamento sustituto a Electrical engineer un formulario para que el seguro cubra el medicamento que se considera necesario.   Si se requiere [employer]  autorizacin previa para que su compaa de seguros [place] su medicamento, por favor permtanos de 1 a 2 das hbiles para completar este proceso.  Los precios de los medicamentos varan con frecuencia dependiendo del Environmental consultant de dnde se surte la receta y alguna farmacias pueden ofrecer precios ms baratos.  El sitio web www.goodrx.com tiene cupones para medicamentos de Airline pilot. Los precios aqu no tienen en cuenta lo que podra costar con la ayuda del seguro (puede ser ms barato con su seguro), pero el sitio web puede darle el precio si no utiliz Research scientist (physical sciences).  - Puede imprimir el cupn correspondiente y llevarlo con su receta a la farmacia.  - Tambin puede pasar por nuestra oficina durante el horario de atencin regular y Charity fundraiser una tarjeta de cupones de GoodRx.  - Si necesita que su receta se enve  electrnicamente a una farmacia diferente, informe a nuestra oficina a travs de MyChart de [place] o por telfono llamando al [phone] y presione la opcin 4.

## 2022-02-24 NOTE — Progress Notes (Signed)
   Follow-Up Visit   Subjective  Melody Reed is a 72 y.o. female who presents for the following: Facial Elastosis (4 week Botox recheck) and lesion (Dur: ~1 month. Scaly spot at right upper, posterior thigh. Getting larger. Has itched. Recheck ISK, left neck, Tx with LN2). The patient has spots, moles and lesions to be evaluated, some may be new or changing and the patient has concerns.  The following portions of the chart were reviewed this encounter and updated as appropriate:  Tobacco  Allergies  Meds  Problems  Med Hx  Surg Hx  Fam Hx     Review of Systems: No other skin or systemic complaints except as noted in HPI or Assessment and Plan.  Objective  Well appearing patient in no apparent distress; mood and affect are within normal limits.  A focused examination was performed including face, neck, right thigh. Relevant physical exam findings are noted in the Assessment and Plan.  face Rhytides and volume loss.   Neck - Anterior Erythematous keratotic or waxy stuck-on papule or plaque.  Right Hip (side) - Posterior Pink, scaly, nummular patch   Assessment & Plan  Elastosis of skin face Plan 2 syringes of Voluma at mid face Plan 1 syringe Restylane Refyne or Defyne at oral commissures Excellent results from BOTOX treatment. Discussed Blepharoplasty.  Inflamed seborrheic keratosis Neck - Left lateral Symptomatic, irritating, patient would like treated. Destruction of lesion - Neck - Anterior Complexity: simple   Destruction method: cryotherapy   Informed consent: discussed and consent obtained   Timeout:  patient name, date of birth, surgical site, and procedure verified Lesion destroyed using liquid nitrogen: Yes   Region frozen until ice ball extended beyond lesion: Yes   Outcome: patient tolerated procedure well with no complications   Post-procedure details: wound care instructions given    Other eczema Right Hip (side) - Posterior Chronic and  persistent condition with duration or expected duration over one year. Condition is symptomatic / bothersome to patient. Not to goal. Atopic dermatitis (eczema) is a chronic, relapsing, pruritic condition that can significantly affect quality of life. It is often associated with allergic rhinitis and/or asthma and can require treatment with topical medications, phototherapy, or in severe cases biologic injectable medication (Dupixent; Adbry) or Oral JAK inhibitors.  May want to see Allergist for allergies. Start Mometasone cream twice daily 2 weeks.  Topical steroids (such as triamcinolone, fluocinolone, fluocinonide, mometasone, clobetasol, halobetasol, betamethasone, hydrocortisone) can cause thinning and lightening of the skin if they are used for too long in the same area. Your physician has selected the right strength medicine for your problem and area affected on the body. Please use your medication only as directed by your physician to prevent side effects.   mometasone (ELOCON) 0.1 % cream - Right Hip (side) - Posterior Apply twice daily up to 2 weeks as needed  Return for fillers as scheduled.  I, Emelia Salisbury, CMA, am acting as scribe for Sarina Ser, MD. Documentation: I have reviewed the above documentation for accuracy and completeness, and I agree with the above.  Sarina Ser, MD

## 2022-03-04 ENCOUNTER — Ambulatory Visit
Admission: RE | Admit: 2022-03-04 | Discharge: 2022-03-04 | Disposition: A | Discharge status: Home / Self Care | Payer: Medicare PPO | Source: Ambulatory Visit | Attending: Family Medicine | Admitting: Family Medicine

## 2022-03-04 DIAGNOSIS — Z1231 Encounter for screening mammogram for malignant neoplasm of breast: Secondary | ICD-10-CM | POA: Diagnosis present

## 2022-04-20 ENCOUNTER — Telehealth: Payer: Self-pay

## 2022-04-20 NOTE — Telephone Encounter (Signed)
Pt called triage wanting to know how long she was on Fosamax. Pt states Dr Rayford Halsted was her dr. I advised her this iffice joined [facility] in 2017 and we do not have any records prior to that in this system.

## 2022-05-09 IMAGING — MG MM DIGITAL SCREENING BILAT W/ TOMO AND CAD
6 of 12 series · 6 of 36 positions shown · non-contrast
Comparison: Previous exam(s).

CLINICAL DATA: Screening.

EXAM:
DIGITAL SCREENING BILATERAL MAMMOGRAM WITH TOMOSYNTHESIS AND CAD
TECHNIQUE: Bilateral screening digital craniocaudal and mediolateral oblique
mammograms were obtained. Bilateral screening digital breast
tomosynthesis was performed. The images were evaluated with
computer-aided detection.

[R MLO synth-2D (1 of 2)]
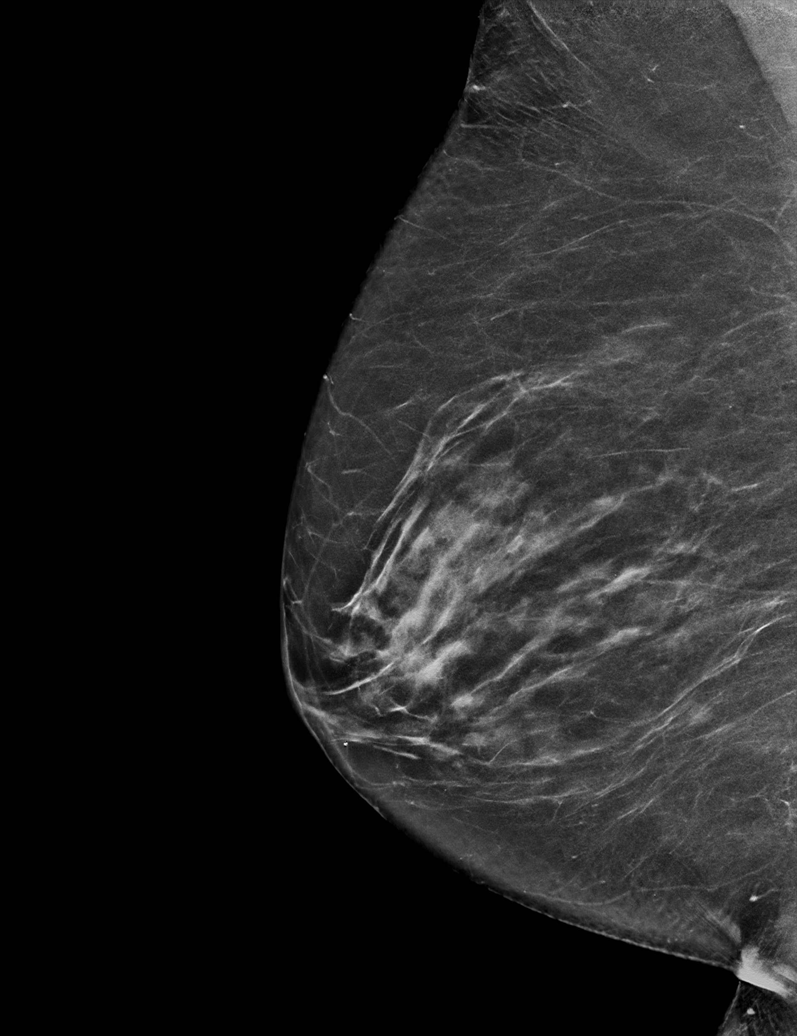

[L MLO synth-2D (1 of 2)]
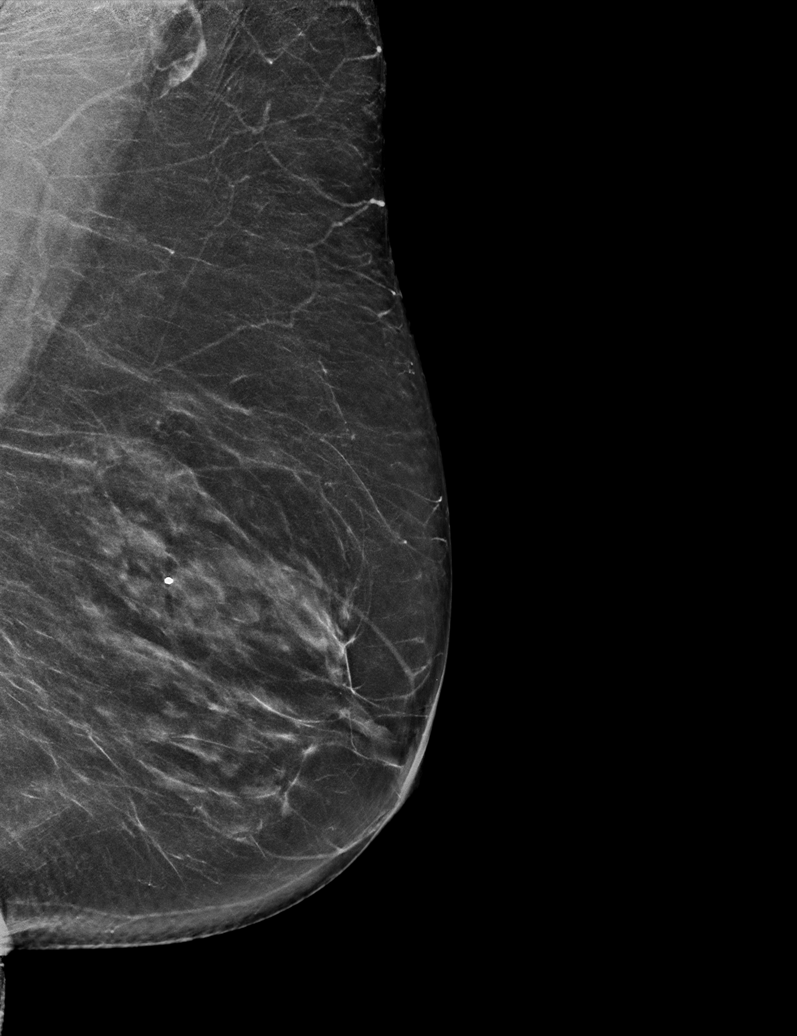

[R CC synth-2D]
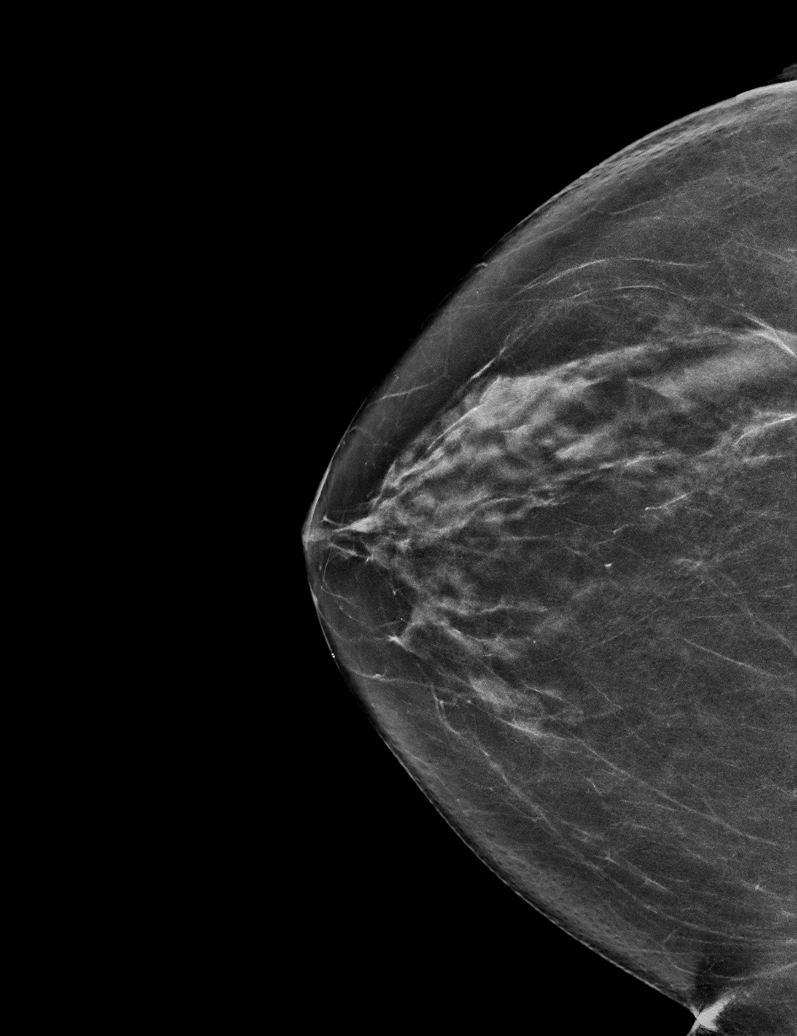

[L MLO synth-2D (2 of 2)]
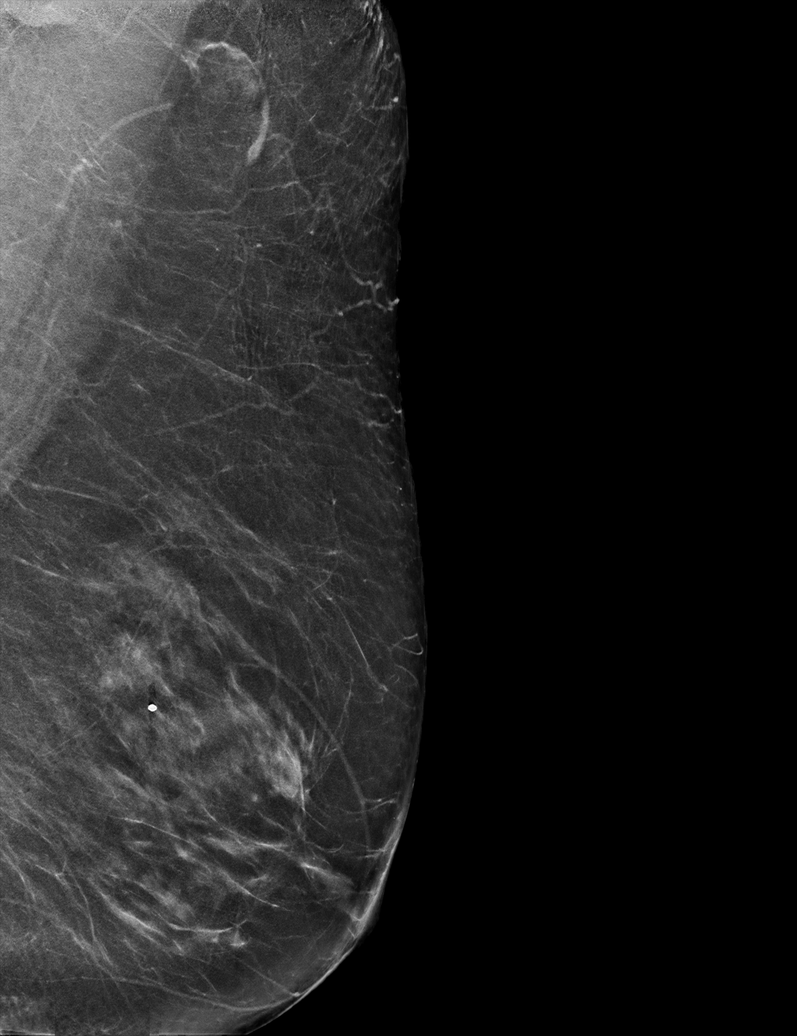

[R MLO synth-2D (2 of 2)]
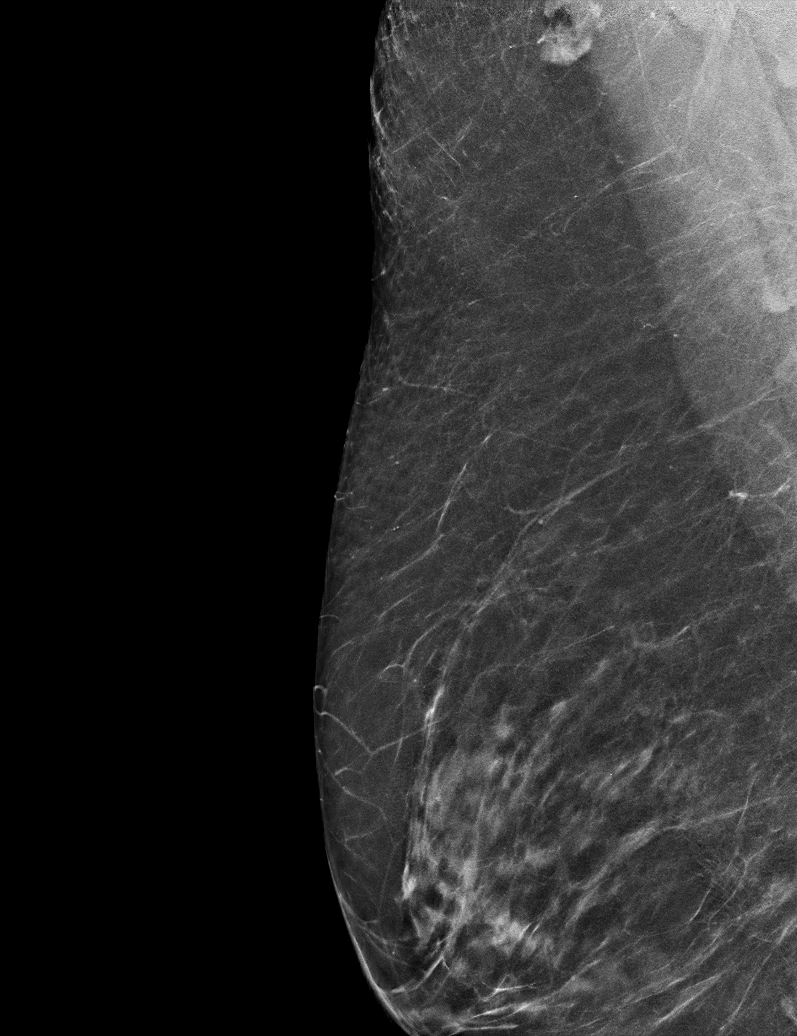

[L CC synth-2D]
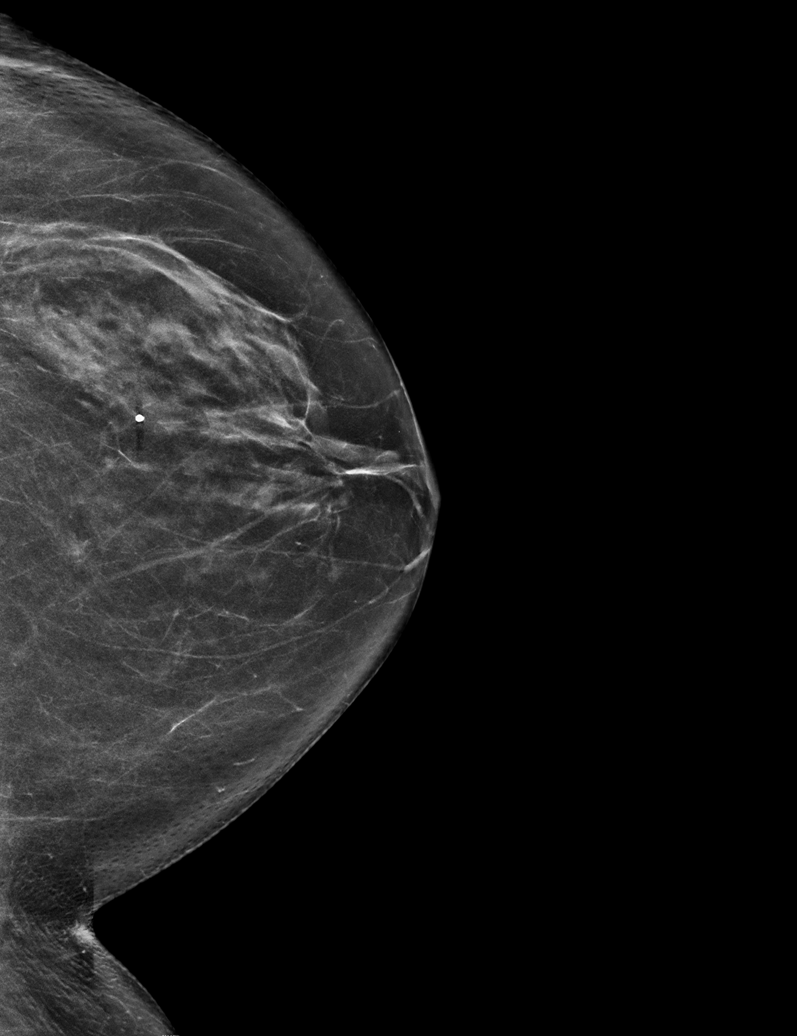

[6 of 36 positions shown; findings below may reference images not displayed]

ACR Breast Density Category b: There are scattered areas of
fibroglandular density.
FINDINGS: There are no findings suspicious for malignancy.
IMPRESSION: No mammographic evidence of malignancy. A result letter of this
screening mammogram will be mailed directly to the patient.

RECOMMENDATION:
Screening mammogram in one year. (Code:51-O-LD2)

BI-RADS CATEGORY  1: Negative.

## 2022-05-13 ENCOUNTER — Ambulatory Visit: Payer: Medicare PPO | Admitting: Dermatology

## 2022-06-23 ENCOUNTER — Telehealth: Payer: Self-pay

## 2022-06-23 NOTE — Telephone Encounter (Signed)
Patient called about her up coming appointment in June for more filler and botox. 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   Patient states her eyelids have been very heavy and getting worse since her botox in December   She wants to keep June appt but no cosmetics?

## 2022-06-25 NOTE — Telephone Encounter (Signed)
Patient advised of information per Dr. Gwen Pounds. Aw

## 2022-08-06 ENCOUNTER — Ambulatory Visit (INDEPENDENT_AMBULATORY_CARE_PROVIDER_SITE_OTHER): Payer: Medicare PPO | Admitting: Dermatology

## 2022-08-06 DIAGNOSIS — W908XXA Exposure to other nonionizing radiation, initial encounter: Secondary | ICD-10-CM

## 2022-08-06 DIAGNOSIS — L820 Inflamed seborrheic keratosis: Secondary | ICD-10-CM

## 2022-08-06 DIAGNOSIS — L578 Other skin changes due to chronic exposure to nonionizing radiation: Secondary | ICD-10-CM

## 2022-08-06 DIAGNOSIS — D692 Other nonthrombocytopenic purpura: Secondary | ICD-10-CM

## 2022-08-06 DIAGNOSIS — L821 Other seborrheic keratosis: Secondary | ICD-10-CM

## 2022-08-06 DIAGNOSIS — L988 Other specified disorders of the skin and subcutaneous tissue: Secondary | ICD-10-CM

## 2022-08-06 NOTE — Progress Notes (Signed)
Follow-Up Visit   Subjective  Melody Reed is a 72 y.o. female who presents for the following: Facial elastosis - patient here today for Botox and filler. The patient has spots, moles and lesions to be evaluated, some may be new or changing and the patient may have concern these could be cancer.  The following portions of the chart were reviewed this encounter and updated as appropriate: medications, allergies, medical history  Review of Systems:  No other skin or systemic complaints except as noted in HPI or Assessment and Plan.  Objective  Well appearing patient in no apparent distress; mood and affect are within normal limits.  A focused examination was performed of the following areas:   Relevant exam findings are noted in the Assessment and Plan.  Face Rhytides and volume loss.                L calf x 1, L wrist x 1 (2) Erythematous stuck-on, waxy papule or plaque               Assessment & Plan     Elastosis of skin Face  Discussed treatment options for sagging skin under the chin/neck area - Skin Tyte and liposuction (best option).  Botox Injection - Face Location: See attached image  Informed consent: Discussed risks (infection, pain, bleeding, bruising, swelling, allergic reaction, paralysis of nearby muscles, eyelid droop, double vision, neck weakness, difficulty breathing, headache, undesirable cosmetic result, and need for additional treatment) and benefits of the procedure, as well as the alternatives.  Informed consent was obtained.  Preparation: The area was cleansed with alcohol.  Procedure Details:  Botox was injected into the dermis with a 30-gauge needle. Pressure applied to any bleeding. Ice packs offered for swelling.  Lot Number:  [id number] Expiration:  06/26  Total Units Injected:  32.5  Plan: Patient was instructed to remain upright for 4 hours. Patient was instructed to avoid massaging the face and avoid vigorous  exercise for the rest of the day. Tylenol may be used for headache.  Allow 2 weeks before returning to clinic for additional dosing as needed. Patient will call for any problems.   Filling material injection - Face Prior to the procedure, the patient's past medical history, allergies and the rare but potential risks and complications were reviewed with the patient and a signed consent was obtained. Pre and post-treatment care was discussed and instructions provided.  Risks including vascular occlusion were discussed.   Location: See attached photo  Filler Type: Restylane refyne x 1 syringe and Restylate Lyft x 2 syringes   Lot number/exp date:  [id number], 07/17/2023; [id number], 03/19/2023  Procedure: The area was prepped thoroughly with Puracyn. After introducing the needle into the desired treatment area, the syringe plunger was drawn back to ensure there was no flash of blood prior to injecting the filler in order to minimize risk of intravascular injection and vascular occlusion.    Patient tolerated the procedure well. The patient will call with any problems, questions or concerns prior to their next appointment.   Inflamed seborrheic keratosis (2) L calf x 1, L wrist x 1  Destruction of lesion - L calf x 1, L wrist x 1 Complexity: simple   Destruction method: cryotherapy   Informed consent: discussed and consent obtained   Timeout:  patient name, date of birth, surgical site, and procedure verified Lesion destroyed using liquid nitrogen: Yes   Region frozen until ice ball extended beyond lesion: Yes   Outcome:  patient tolerated procedure well with no complications   Post-procedure details: wound care instructions given    SEBORRHEIC KERATOSIS - L thigh - Stuck-on, waxy, tan-brown papules and/or plaques  - Benign-appearing - Discussed benign etiology and prognosis. - Observe - Call for any changes  ACTINIC DAMAGE - chronic, secondary to cumulative UV radiation exposure/sun exposure  over time - diffuse scaly erythematous macules with underlying dyspigmentation - Recommend daily broad spectrum sunscreen SPF 30+ to sun-exposed areas, reapply every 2 hours as needed.  - Recommend staying in the shade or wearing long sleeves, sun glasses (UVA+UVB protection) and wide brim hats (4-inch brim around the entire circumference of the hat). - Call for new or changing lesions.  Purpura, arms - Chronic; persistent and recurrent.  Treatable, but not curable. - Violaceous macules and patches - Benign - Related to trauma, age, sun damage and/or use of blood thinners, chronic use of topical and/or oral steroids - Observe - Can use OTC arnica containing moisturizer such as Dermend Bruise Formula if desired - Call for worsening or other concerns  Return in about 4 months (around 12/06/2022) for Botox injections.  Maylene Roes, CMA, am acting as scribe for Armida Sans, MD .  Documentation: I have reviewed the above documentation for accuracy and completeness, and I agree with the above.  Armida Sans, MD

## 2022-08-06 NOTE — Patient Instructions (Signed)
Due to recent changes in healthcare laws, you may see results of your pathology and/or laboratory studies on MyChart before the doctors have had a chance to review them. We understand that in some cases there may be results that are confusing or concerning to you. Please understand that not all results are received at the same time and often the doctors may need to interpret multiple results in order to provide you with the best plan of care or course of treatment. Therefore, we ask that you please give us 2 business days to thoroughly review all your results before contacting the office for clarification. Should we see a critical lab result, you will be contacted sooner.   If You Need Anything After Your Visit  If you have any questions or concerns for your doctor, please call our main line at [phone] and press option 4 to reach your doctor's medical assistant. If no one answers, please leave a voicemail as directed and we will return your call as soon as possible. Messages left after 4 pm will be answered the following business day.   You may also send us a message via MyChart. We typically respond to MyChart messages within 1-2 business days.  For prescription refills, please ask your pharmacy to contact our office. Our fax number is [phone].  If you have an urgent issue when the clinic is closed that cannot wait until the next business day, you can page your doctor at the number below.    Please note that while we do our best to be available for urgent issues outside of office hours, we are not available 24/7.   If you have an urgent issue and are unable to reach us, you may choose to seek medical care at your doctor's office, retail clinic, urgent care center, or emergency room.  If you have a medical emergency, please immediately call 911 or go to the emergency department.  Pager Numbers  - Dr. Kowalski: [phone]  - Dr. Moye: [phone]  - Dr. Stewart:  [phone]  In the event of inclement weather, please call our main line at [phone] for an update on the status of any delays or closures.  Dermatology Medication Tips: Please keep the boxes that topical medications come in in order to help keep track of the instructions about where and how to use these. Pharmacies typically print the medication instructions only on the boxes and not directly on the medication tubes.   If your medication is too expensive, please contact our office at [phone] option 4 or send us a message through MyChart.   We are unable to tell what your co-pay for medications will be in advance as this is different depending on your insurance coverage. However, we may be able to find a substitute medication at lower cost or fill out paperwork to get insurance to cover a needed medication.   If a prior authorization is required to get your medication covered by your insurance company, please allow us 1-2 business days to complete this process.  Drug prices often vary depending on where the prescription is filled and some pharmacies may offer cheaper prices.  The website www.goodrx.com contains coupons for medications through different pharmacies. The prices here do not account for what the cost may be with help from insurance (it may be cheaper with your insurance), but the website can give you the price if you did not use any insurance.  - You can print the associated coupon and take it with   your prescription to the pharmacy.  - You may also stop by our office during regular business hours and pick up a GoodRx coupon card.  - If you need your prescription sent electronically to a different pharmacy, notify our office through [facility] MyChart or by phone at [phone] option 4.     Si Usted Necesita Algo Despus de Su Visita  Tambin puede enviarnos un mensaje a travs de MyChart. Por lo general respondemos a los mensajes de MyChart en el transcurso de 1 a 2  das hbiles.  Para renovar recetas, por favor pida a su farmacia que se ponga en contacto con nuestra oficina. Nuestro nmero de fax es el [phone].  Si tiene un asunto urgente cuando la clnica est cerrada y que no puede esperar hasta el siguiente da hbil, puede llamar/localizar a su doctor(a) al nmero que aparece a continuacin.   Por favor, tenga en cuenta que aunque hacemos todo lo posible para estar disponibles para asuntos urgentes fuera del horario de oficina, no estamos disponibles las 24 horas del da, los 7 das de la semana.   Si tiene un problema urgente y no puede comunicarse con nosotros, puede optar por buscar atencin mdica  en el consultorio de su doctor(a), en una clnica privada, en un centro de atencin urgente o en una sala de emergencias.  Si tiene una emergencia mdica, por favor llame inmediatamente al 911 o vaya a la sala de emergencias.  Nmeros de bper  - Dr. Kowalski: [phone]  - Dra. Moye: [phone]  - Dra. Stewart: [phone]  En caso de inclemencias del tiempo, por favor llame a nuestra lnea principal al [phone] para una actualizacin sobre el estado de cualquier retraso o cierre.  Consejos para la medicacin en dermatologa: Por favor, guarde las cajas en las que vienen los medicamentos de uso tpico para ayudarle a seguir las instrucciones sobre dnde y cmo usarlos. Las farmacias generalmente imprimen las instrucciones del medicamento slo en las cajas y no directamente en los tubos del medicamento.   Si su medicamento es muy caro, por favor, pngase en contacto con nuestra oficina llamando al [phone] y presione la opcin 4 o envenos un mensaje a travs de MyChart.   No podemos decirle cul ser su copago por los medicamentos por adelantado ya que esto es diferente dependiendo de la cobertura de su seguro. Sin embargo, es posible que podamos encontrar un medicamento sustituto a menor costo o llenar un formulario para que el  seguro cubra el medicamento que se considera necesario.   Si se requiere una autorizacin previa para que su compaa de seguros cubra su medicamento, por favor permtanos de 1 a 2 das hbiles para completar este proceso.  Los precios de los medicamentos varan con frecuencia dependiendo del lugar de dnde se surte la receta y alguna farmacias pueden ofrecer precios ms baratos.  El sitio web www.goodrx.com tiene cupones para medicamentos de diferentes farmacias. Los precios aqu no tienen en cuenta lo que podra costar con la ayuda del seguro (puede ser ms barato con su seguro), pero el sitio web puede darle el precio si no utiliz ningn seguro.  - Puede imprimir el cupn correspondiente y llevarlo con su receta a la farmacia.  - Tambin puede pasar por nuestra oficina durante el horario de atencin regular y recoger una tarjeta de cupones de GoodRx.  - Si necesita que su receta se enve electrnicamente a una farmacia diferente, informe a nuestra oficina a travs de MyChart de [facility]   o por telfono llamando al [phone] y presione la opcin 4.  

## 2022-08-09 ENCOUNTER — Encounter: Payer: Self-pay | Admitting: Dermatology

## 2022-08-13 ENCOUNTER — Telehealth: Payer: Self-pay

## 2022-08-13 NOTE — Telephone Encounter (Signed)
Patient called today with questions concerning her appointment on 08/06/22 for fillers and Botox. She asked me to tell you she is very pleased with her results but now that the swelling has gone down she has noticed a little less volume at right cheekbone area. She said that when you were injecting the filler you may have hit a nerve because it was painful. She wants to know if she should come in sooner then her appt on 01/21/23. She would also like to know if you think she is a candidate for jaw filler.

## 2022-12-01 ENCOUNTER — Ambulatory Visit (INDEPENDENT_AMBULATORY_CARE_PROVIDER_SITE_OTHER): Payer: Self-pay | Admitting: Dermatology

## 2022-12-01 ENCOUNTER — Encounter: Payer: Self-pay | Admitting: Dermatology

## 2022-12-01 VITALS — BP 101/66

## 2022-12-01 DIAGNOSIS — L988 Other specified disorders of the skin and subcutaneous tissue: Secondary | ICD-10-CM

## 2022-12-01 NOTE — Patient Instructions (Signed)

## 2022-12-01 NOTE — Progress Notes (Signed)
   Follow-Up Visit   Subjective  Melody Reed is a 72 y.o. female who presents for the following: Botox for facial elastosis  The following portions of the chart were reviewed this encounter and updated as appropriate: medications, allergies, medical history  Review of Systems:  No other skin or systemic complaints except as noted in HPI or Assessment and Plan.  Objective  Well appearing patient in no apparent distress; mood and affect are within normal limits.  A focused examination was performed of the face.  Relevant physical exam findings are noted in the Assessment and Plan.  Injection map photo     Assessment & Plan    Facial Elastosis Discussed adding 4 units to upper lip  Botox 36.5 units injected today to: - Frown complex 27.5 units - Forehead 5 units - Upper lip 4 units  Location: frown complex, forehead, upper lip  Informed consent: Discussed risks (infection, pain, bleeding, bruising, swelling, allergic reaction, paralysis of nearby muscles, eyelid droop, double vision, neck weakness, difficulty breathing, headache, undesirable cosmetic result, and need for additional treatment) and benefits of the procedure, as well as the alternatives.  Informed consent was obtained.  Preparation: The area was cleansed with alcohol.  Procedure Details:  Botox was injected into the dermis with a 30-gauge needle. Pressure applied to any bleeding. Ice packs offered for swelling.  Lot Number:  [id number] Expiration:  10/2024  Total Units Injected:  36.5  Plan: Tylenol may be used for headache.  Allow 2 weeks before returning to clinic for additional dosing as needed. Patient will call for any problems.  Good results from previous filler to mid face and corners of the mouth.  At next filler appointment will plan to inject the mid face, corners of mouth, may consider the angle of mandible.  Return for as scheduled for filler and botox.  I, Ardis Rowan, RMA, am acting  as scribe for Armida Sans, MD .   Documentation: I have reviewed the above documentation for accuracy and completeness, and I agree with the above.  Armida Sans, MD

## 2022-12-08 ENCOUNTER — Encounter: Payer: Self-pay | Admitting: Dermatology

## 2023-01-21 ENCOUNTER — Ambulatory Visit: Payer: Medicare PPO | Admitting: Dermatology

## 2023-02-15 ENCOUNTER — Other Ambulatory Visit: Payer: Self-pay | Admitting: Family Medicine

## 2023-02-15 DIAGNOSIS — Z1231 Encounter for screening mammogram for malignant neoplasm of breast: Secondary | ICD-10-CM

## 2023-03-17 ENCOUNTER — Ambulatory Visit
Admission: RE | Admit: 2023-03-17 | Discharge: 2023-03-17 | Disposition: A | Discharge status: Home / Self Care | Payer: Medicare PPO | Source: Ambulatory Visit | Attending: Family Medicine | Admitting: Family Medicine

## 2023-03-17 DIAGNOSIS — Z1231 Encounter for screening mammogram for malignant neoplasm of breast: Secondary | ICD-10-CM | POA: Insufficient documentation

## 2023-04-28 ENCOUNTER — Ambulatory Visit: Payer: Medicare PPO | Admitting: Dermatology

## 2023-07-19 ENCOUNTER — Ambulatory Visit: Payer: Medicare PPO | Admitting: Dermatology

## 2023-07-19 DIAGNOSIS — L820 Inflamed seborrheic keratosis: Secondary | ICD-10-CM

## 2023-07-19 DIAGNOSIS — L988 Other specified disorders of the skin and subcutaneous tissue: Secondary | ICD-10-CM

## 2023-07-19 NOTE — Progress Notes (Signed)
   Follow-Up Visit   Subjective  Melody Reed is a 73 y.o. female who presents for the following: Facial elastosis. She would like Botox injections today and discuss filler. Last Botox 11/2022. She has a residual ISK on the left calf she would like treated again today, picks at.  The following portions of the chart were reviewed this encounter and updated as appropriate: medications, allergies, medical history  Review of Systems:  No other skin or systemic complaints except as noted in HPI or Assessment and Plan.  Objective  Well appearing patient in no apparent distress; mood and affect are within normal limits.  A focused examination was performed of the following areas: Face  Relevant physical exam findings are noted in the Assessment and Plan.  Left calf Erythematous stuck-on, waxy papule or plaque  Before filler         After filler       Assessment & Plan  Facial Elastosis  Location: See attached image   Informed consent: Discussed risks (infection, pain, bleeding, bruising, swelling, allergic reaction, paralysis of nearby muscles, eyelid droop, double vision, neck weakness, difficulty breathing, headache, undesirable cosmetic result, and need for additional treatment) and benefits of the procedure, as well as the alternatives.  Informed consent was obtained.  Preparation: The area was cleansed with alcohol.  Procedure Details:  Botox was injected into the dermis with a 30-gauge needle. Pressure applied to any bleeding. Ice packs offered for swelling.  Lot Number:  [id number] Expiration:  05/2025  Total Units Injected:  32.5 units  Plan: Tylenol  may be used for headache.  Allow 2 weeks before returning to clinic for additional dosing as needed. Patient will call for any problems.   Prior to the procedure, the patient's past medical history, allergies and the rare but potential risks and complications were reviewed with the patient and a signed  consent was obtained. Pre and post-treatment care was discussed and instructions provided.  Location: marionette lines  Filler Type: Restylane defyne  Procedure: The area was prepped thoroughly with Puracyn. After introducing the needle into the desired treatment area, the syringe plunger was drawn back to ensure there was no flash of blood prior to injecting the filler in order to minimize risk of intravascular injection and vascular occlusion. After injection of the filler, the treated areas were cleansed and iced to reduce swelling. Post-treatment instructions were reviewed with the patient.       Patient tolerated the procedure well. The patient will call with any problems, questions or concerns prior to their next appointment.  INFLAMED SEBORRHEIC KERATOSIS Left calf Residual.  Symptomatic, irritating, patient would like treated. Destruction of lesion - Left calf  Destruction method: cryotherapy   Informed consent: discussed and consent obtained   Lesion destroyed using liquid nitrogen: Yes   Region frozen until ice ball extended beyond lesion: Yes   Outcome: patient tolerated procedure well with no complications   Post-procedure details: wound care instructions given   Additional details:  Prior to procedure, discussed risks of blister formation, small wound, skin dyspigmentation, or rare scar following cryotherapy. Recommend Vaseline ointment to treated areas while healing.    Return 3-4 mos, for Botox.  IBernardine Bridegroom, CMA, am acting as scribe for Artemio Larry, MD .   Documentation: I have reviewed the above documentation for accuracy and completeness, and I agree with the above.  Artemio Larry, MD

## 2023-07-19 NOTE — Patient Instructions (Addendum)

## 2023-10-25 ENCOUNTER — Ambulatory Visit (INDEPENDENT_AMBULATORY_CARE_PROVIDER_SITE_OTHER): Payer: Self-pay | Admitting: Dermatology

## 2023-10-25 DIAGNOSIS — L988 Other specified disorders of the skin and subcutaneous tissue: Secondary | ICD-10-CM

## 2023-10-25 NOTE — Patient Instructions (Signed)

## 2023-10-25 NOTE — Progress Notes (Signed)
   Follow-Up Visit   Subjective  Melody Reed is a 73 y.o. female who presents for the following: Botox for facial elastosis. She also wants to discuss fillers to the nasolabial folds. Patient had a lot of bruising after Restylane Defyne to the marionette lines last visit.   The following portions of the chart were reviewed this encounter and updated as appropriate: medications, allergies, medical history  Review of Systems:  No other skin or systemic complaints except as noted in HPI or Assessment and Plan.  Objective  Well appearing patient in no apparent distress; mood and affect are within normal limits.  A focused examination was performed of the face.  Relevant physical exam findings are noted in the Assessment and Plan.  Injection map photo    Assessment & Plan    Facial Elastosis  Location: See attached image  Informed consent: Discussed risks (infection, pain, bleeding, bruising, swelling, allergic reaction, paralysis of nearby muscles, eyelid droop, double vision, neck weakness, difficulty breathing, headache, undesirable cosmetic result, and need for additional treatment) and benefits of the procedure, as well as the alternatives.  Informed consent was obtained.  Preparation: The area was cleansed with alcohol.  Procedure Details:  Botox was injected into the dermis with a 30-gauge needle. Pressure applied to any bleeding. Ice packs offered for swelling.  Lot Number:  [id number] Expiration:  06/2025  Total Units Injected:  32.5 units  Plan: Tylenol  may be used for headache.  Allow 2 weeks before returning to clinic for additional dosing as needed. Patient will call for any problems.  Return 4-5 months, for Botox and Fillers. Defyne to nasolabial folds.SABRA LILLETTE Andrea Ezzard, CMA, am acting as scribe for Rexene Rattler, MD .   Documentation: I have reviewed the above documentation for accuracy and completeness, and I agree with the above.  Rexene Rattler,  MD

## 2023-11-24 ENCOUNTER — Ambulatory Visit: Payer: Medicare PPO | Admitting: Dermatology

## 2024-02-21 ENCOUNTER — Encounter: Payer: Self-pay | Admitting: Dermatology

## 2024-02-21 ENCOUNTER — Ambulatory Visit (INDEPENDENT_AMBULATORY_CARE_PROVIDER_SITE_OTHER): Payer: Self-pay | Admitting: Dermatology

## 2024-02-21 DIAGNOSIS — L988 Other specified disorders of the skin and subcutaneous tissue: Secondary | ICD-10-CM

## 2024-02-21 NOTE — Patient Instructions (Signed)

## 2024-02-21 NOTE — Progress Notes (Signed)
" ° °  Follow-Up Visit   Subjective  Melody Reed is a 74 y.o. female who presents for the following: filler and botox for facial elastosis  The following portions of the chart were reviewed this encounter and updated as appropriate: medications, allergies, medical history  Review of Systems:  No other skin or systemic complaints except as noted in HPI or Assessment and Plan.  Objective  Well appearing patient in no apparent distress; mood and affect are within normal limits.  A focused examination was performed of the face. Relevant physical exam findings are noted in the Assessment and Plan or shown in photos.  Before photos                 After photos            Injection map photo     Assessment & Plan    Facial Elastosis  Prior to the procedure, the patient's past medical history, allergies and the rare but potential risks and complications were reviewed with the patient and a signed consent was obtained. Pre and post-treatment care was discussed and instructions provided.   Location: marionette lines  Filler Type: Restylane defyne  Procedure: The area was prepped thoroughly with Puracyn. After introducing the needle into the desired treatment area, the syringe plunger was drawn back to ensure there was no flash of blood prior to injecting the filler in order to minimize risk of intravascular injection and vascular occlusion. After injection of the filler, the treated areas were cleansed and iced to reduce swelling. Post-treatment instructions were reviewed with the patient.       Patient tolerated the procedure well. The patient will call with any problems, questions or concerns prior to their next appointment.  Facial Elastosis Botox 32.5 units injected today to: - Frown complex 27.5 units - Forehead 5 units  Location: frown complex, forehead  Informed consent: Discussed risks (infection, pain, bleeding, bruising, swelling, allergic  reaction, paralysis of nearby muscles, eyelid droop, double vision, neck weakness, difficulty breathing, headache, undesirable cosmetic result, and need for additional treatment) and benefits of the procedure, as well as the alternatives.  Informed consent was obtained.  Preparation: The area was cleansed with alcohol.  Procedure Details:  Botox was injected into the dermis with a 30-gauge needle. Pressure applied to any bleeding. Ice packs offered for swelling.  Lot Number:  [id number] Expiration:  12/2025  Total Units Injected:  32.5  Plan: Tylenol  may be used for headache.  Allow 2 weeks before returning to clinic for additional dosing as needed. Patient will call for any problems.   Return for 3-30m Botox.  I, Grayce Saunas, RMA, am acting as scribe for Rexene Rattler, MD .   Documentation: I have reviewed the above documentation for accuracy and completeness, and I agree with the above.  Rexene Rattler, MD      "

## 2024-06-12 ENCOUNTER — Ambulatory Visit: Admitting: Dermatology
